# Patient Record
Sex: Male | Born: 1990 | Race: Black or African American | Hispanic: No | Marital: Single | Smoking: Former smoker
Health system: Southern US, Community
[De-identification: ages and names within clinical notes are randomized; demographics above are authoritative.]

## PROBLEM LIST (undated history)

## (undated) DIAGNOSIS — D573 Sickle-cell trait: Secondary | ICD-10-CM

---

## 2011-12-24 ENCOUNTER — Emergency Department (HOSPITAL_COMMUNITY)
Admission: EM | Admit: 2011-12-24 | Discharge: 2011-12-24 | Disposition: A | Discharge status: Home / Self Care | Payer: BC Managed Care – PPO | Attending: Emergency Medicine | Admitting: Emergency Medicine

## 2011-12-24 ENCOUNTER — Encounter: Payer: Self-pay | Admitting: Emergency Medicine

## 2011-12-24 DIAGNOSIS — R252 Cramp and spasm: Secondary | ICD-10-CM

## 2011-12-24 DIAGNOSIS — M7989 Other specified soft tissue disorders: Secondary | ICD-10-CM

## 2011-12-24 DIAGNOSIS — D573 Sickle-cell trait: Secondary | ICD-10-CM | POA: Insufficient documentation

## 2011-12-24 DIAGNOSIS — M79609 Pain in unspecified limb: Secondary | ICD-10-CM

## 2011-12-24 DIAGNOSIS — F17200 Nicotine dependence, unspecified, uncomplicated: Secondary | ICD-10-CM | POA: Insufficient documentation

## 2011-12-24 HISTORY — DX: Sickle-cell trait: D57.3

## 2011-12-24 MED ORDER — IBUPROFEN 600 MG PO TABS: 600.0000 mg | ORAL_TABLET | Freq: Four times a day (QID) | ORAL | Status: AC | PRN

## 2011-12-24 MED ORDER — IBUPROFEN 200 MG PO TABS
600.0000 mg | ORAL_TABLET | Freq: Once | ORAL | Status: AC
  Administered 2011-12-24: 600 mg via ORAL
  Filled 2011-12-24: qty 3

## 2011-12-24 NOTE — ED Provider Notes (Addendum)
History     CSN: [id number]  Arrival date & time 12/24/11  1242   First MD Initiated Contact with Patient 12/24/11 1540      Chief Complaint  Patient presents with  . Leg Pain    (Consider location/radiation/quality/duration/timing/severity/associated sxs/prior treatment) HPI Comments: Patient reports since yesterday has developed some crampy pain that comes and goes on the left side. He reports no skin rash, fever or drainage. No abrasions or lacerations or breaks in the skin. He did have a 2-1/2 hour drive from out of all of to [place] on Friday. He denies chest pain or shortness of breath. She denies any falls or obvious injuries. He reports that he is otherwise healthy.  Patient is a 21 y.o. male presenting with leg pain. The history is provided by the patient.  Leg Pain  Pertinent negatives include no numbness.    Past Medical History  Diagnosis Date  . Sickle-cell trait     History reviewed. No pertinent past surgical history.  History reviewed. No pertinent family history.  History  Substance Use Topics  . Smoking status: Current Everyday Smoker  . Smokeless tobacco: Not on file  . Alcohol Use: Yes      Review of Systems  Constitutional: Negative.   Respiratory: Negative for chest tightness and shortness of breath.   Cardiovascular: Positive for leg swelling. Negative for chest pain and palpitations.  Musculoskeletal: Negative for back pain and joint swelling.  Skin: Negative for color change, rash and wound.  Neurological: Negative for weakness, light-headedness and numbness.    Allergies  Review of patient's allergies indicates no known allergies.  Home Medications  No current outpatient prescriptions on file.  BP 149/82  Pulse 60  Temp(Src) 98.3 F (36.8 C) (Oral)  Resp 18  SpO2 99%  Physical Exam  Nursing note and vitals reviewed. Constitutional: He appears well-developed and well-nourished.  Pulmonary/Chest: Effort normal. No respiratory  distress.  Musculoskeletal: Normal range of motion. He exhibits no edema and no tenderness.  Skin: Skin is warm and dry. No rash noted. No erythema.    ED Course  Procedures (including critical care time)  Labs Reviewed - No data to display No results found.   1. Muscle cramp       MDM  Minor risk for DVT since no good alternative diagnosis is complete.  Doppler study is negative.  Results discussed with family and pt.           Gavin Pound. Oletta Lamas, MD 12/24/11 1704  Gavin Pound. Dearius Hoffmann, MD 12/24/11 1704

## 2011-12-24 NOTE — ED Notes (Signed)
Denies injury to leg  Back of leg hurts from knee to ankle states gave blood for $ he states

## 2011-12-24 NOTE — Discharge Instructions (Signed)
 Leg Cramps Leg cramps that occur during exercise can be caused by poor circulation or dehydration. However, muscle cramps that occur at rest or during the night are usually not due to any serious medical problem. Heat cramps may cause muscle spasms during hot weather.  CAUSES There is no clear cause for muscle cramps. However, dehydration may be a factor for those who do not drink enough fluids and those who exercise in the heat. Imbalances in the level of sodium, potassium, calcium  or magnesium in the muscle tissue may also be a factor. Some medications, such as water pills (diuretics), may cause loss of chemicals that the body needs (like sodium and potassium) and cause muscle cramps. TREATMENT   Make sure your diet has enough fluids and essential minerals for the muscle to work normally.   Avoid strenuous exercise for several days if you have been having frequent leg cramps.   Stretch and massage the cramped muscle for several minutes.   Some medicines may be helpful in some patients with night cramps. Only take over-the-counter or prescription medicines as directed by your caregiver.  SEEK IMMEDIATE MEDICAL CARE IF:   Your leg cramps become worse.   Your foot becomes cold, numb, or blue.  Document Released: 01/10/2005 Document Revised: 08/15/2011 Document Reviewed: 12/28/2008 [facility] Patient Information 2012 [place], [place].     Your ultrasound was normal.  Follow up with your primary care physician next week if symptoms continue to persist.

## 2011-12-24 NOTE — ED Notes (Signed)
Left leg pain since last night, pt just came from giving blood

## 2011-12-24 NOTE — Progress Notes (Signed)
*  PRELIMINARY RESULTS*  Left Lower Extremity Venous Duplex has been performed.  Left:  No evidence of DVT, superficial thrombosis, or Baker's cyst.   Farrel Demark RDMS 12/24/2011, 4:45 PM

## 2014-08-10 ENCOUNTER — Emergency Department (HOSPITAL_COMMUNITY)
Admission: EM | Admit: 2014-08-10 | Discharge: 2014-08-10 | Disposition: A | Discharge status: Home / Self Care | Payer: BC Managed Care – PPO | Attending: Emergency Medicine | Admitting: Emergency Medicine

## 2014-08-10 ENCOUNTER — Encounter (HOSPITAL_COMMUNITY): Payer: Self-pay | Admitting: Emergency Medicine

## 2014-08-10 ENCOUNTER — Emergency Department (HOSPITAL_COMMUNITY): Payer: BC Managed Care – PPO

## 2014-08-10 DIAGNOSIS — N451 Epididymitis: Secondary | ICD-10-CM

## 2014-08-10 DIAGNOSIS — F17200 Nicotine dependence, unspecified, uncomplicated: Secondary | ICD-10-CM | POA: Insufficient documentation

## 2014-08-10 DIAGNOSIS — N509 Disorder of male genital organs, unspecified: Secondary | ICD-10-CM | POA: Diagnosis present

## 2014-08-10 DIAGNOSIS — N453 Epididymo-orchitis: Secondary | ICD-10-CM | POA: Insufficient documentation

## 2014-08-10 DIAGNOSIS — Z862 Personal history of diseases of the blood and blood-forming organs and certain disorders involving the immune mechanism: Secondary | ICD-10-CM | POA: Insufficient documentation

## 2014-08-10 MED ORDER — FENTANYL CITRATE 0.05 MG/ML IJ SOLN: 50.0000 ug | Freq: Once | INTRAMUSCULAR | Status: DC

## 2014-08-10 MED ORDER — OXYCODONE-ACETAMINOPHEN 5-325 MG PO TABS
1.0000 | ORAL_TABLET | Freq: Once | ORAL | Status: AC
  Administered 2014-08-10: 1 via ORAL
  Filled 2014-08-10: qty 1

## 2014-08-10 MED ORDER — HYDROCODONE-ACETAMINOPHEN 5-325 MG PO TABS: 1.0000 | ORAL_TABLET | Freq: Four times a day (QID) | ORAL | Status: DC | PRN

## 2014-08-10 MED ORDER — CIPROFLOXACIN HCL 500 MG PO TABS: 500.0000 mg | ORAL_TABLET | Freq: Two times a day (BID) | ORAL | Status: DC

## 2014-08-10 NOTE — Discharge Instructions (Signed)
Cipro as prescribed. Hydrocodone as prescribed as needed for pain.  Return to the emergency department for worsening pain, high fever, and follow up with your primary Dr. if not improving in the next week.   Epididymitis Epididymitis is a swelling (inflammation) of the epididymis. The epididymis is a cord-like structure along the back part of the testicle. Epididymitis is usually, but not always, caused by infection. This is usually a sudden problem beginning with chills, fever and pain behind the scrotum and in the testicle. There may be swelling and redness of the testicle. DIAGNOSIS  Physical examination will reveal a tender, swollen epididymis. Sometimes, cultures are obtained from the urine or from prostate secretions to help find out if there is an infection or if the cause is a different problem. Sometimes, blood work is performed to see if your white blood cell count is elevated and if a germ (bacterial) or viral infection is present. Using this knowledge, an appropriate medicine which kills germs (antibiotic) can be chosen by your caregiver. A viral infection causing epididymitis will most often go away (resolve) without treatment. HOME CARE INSTRUCTIONS   Hot sitz baths for 20 minutes, 4 times per day, may help relieve pain.  Only take over-the-counter or prescription medicines for pain, discomfort or fever as directed by your caregiver.  Take all medicines, including antibiotics, as directed. Take the antibiotics for the full prescribed length of time even if you are feeling better.  It is very important to keep all follow-up appointments. SEEK IMMEDIATE MEDICAL CARE IF:   You have a fever.  You have pain not relieved with medicines.  You have any worsening of your problems.  Your pain seems to come and go.  You develop pain, redness, and swelling in the scrotum and surrounding areas. MAKE SURE YOU:   Understand these instructions.  Will watch your condition.  Will get  help right away if you are not doing well or get worse. Document Released: 11/30/2000 Document Revised: 02/25/2012 Document Reviewed: 10/20/2009 [facility] Patient Information 2015 [place], [place]. This information is not intended to replace advice given to you by your health care provider. Make sure you discuss any questions you have with your health care provider.

## 2014-08-10 NOTE — ED Notes (Addendum)
Pt reports waking up this am with severe pain to right testicle. No relief with ibuprofen pta. Denies any urinary symptoms.

## 2014-08-10 NOTE — ED Provider Notes (Signed)
CSN: [id number]     Arrival date & time 08/10/14  1304 History   First MD Initiated Contact with Patient 08/10/14 1353     Chief Complaint  Patient presents with  . Testicle Pain     (Consider location/radiation/quality/duration/timing/severity/associated sxs/prior Treatment) Patient is a 23 y.o. male presenting with testicular pain. The history is provided by the patient.  Testicle Pain This is a new problem. Episode onset: This Morning. The problem occurs constantly. The problem has been gradually worsening. Pertinent negatives include no chest pain and no abdominal pain. Nothing aggravates the symptoms. Nothing relieves the symptoms. He has tried nothing for the symptoms. The treatment provided no relief.    Past Medical History  Diagnosis Date  . Sickle-cell trait    History reviewed. No pertinent past surgical history. History reviewed. No pertinent family history. History  Substance Use Topics  . Smoking status: Current Every Day Smoker  . Smokeless tobacco: Not on file  . Alcohol Use: Yes    Review of Systems  Cardiovascular: Negative for chest pain.  Gastrointestinal: Negative for abdominal pain.  Genitourinary: Positive for testicular pain.  All other systems reviewed and are negative.     Allergies  Review of patient's allergies indicates no known allergies.  Home Medications   Prior to Admission medications   Medication Sig Start Date End Date Taking? Authorizing Provider  ibuprofen (ADVIL,MOTRIN) 200 MG tablet Take 200-800 mg by mouth every 6 (six) hours as needed for mild pain or moderate pain.   Yes Historical Provider, MD   BP 134/80  Pulse 65  Temp(Src) 98.4 F (36.9 C) (Oral)  Resp 24  Ht 6' (1.829 m)  Wt 245 lb (111.131 kg)  BMI 33.22 kg/m2  SpO2 99% Physical Exam  Nursing note and vitals reviewed. Constitutional: He is oriented to person, place, and time. He appears well-developed and well-nourished. No distress.  HENT:  Head:  Normocephalic and atraumatic.  Neck: Normal range of motion. Neck supple.  Genitourinary:  The external genitalia appears grossly normal. The right testicle is tender to palpation, however is freely mobile. There are no suspicious lesions.  Neurological: He is alert and oriented to person, place, and time.  Skin: Skin is warm and dry. He is not diaphoretic.    ED Course  Procedures (including critical care time) Labs Review Labs Reviewed - No data to display  Imaging Review US Scrotum  08/10/2014   CLINICAL DATA:  RIGHT testicle pain.  No trauma.  EXAM: SCROTAL ULTRASOUND  DOPPLER ULTRASOUND OF THE TESTICLES  TECHNIQUE: Complete ultrasound examination of the testicles, epididymis, and other scrotal structures was performed. Color and spectral Doppler ultrasound were also utilized to evaluate blood flow to the testicles.  COMPARISON:  None.  FINDINGS: Right testicle  Measurements: 4.2 x 2.4 x 3.6 cm. No mass or microlithiasis visualized.  Left testicle  Measurements: 4.4 x 2.0 x 2.5 cm. No mass or microlithiasis visualized.  Right epididymis:  Mildly enlarged.  Left epididymis:  Normal in size and appearance.  Hydrocele:  Present on the RIGHT.  Varicocele:  None visualized.  Pulsed Doppler interrogation of both testes demonstrates low resistance arterial and venous waveforms bilaterally.  IMPRESSION: Mildly enlarged RIGHT epididymis. RIGHT hydrocele. Normal testicles.   Electronically Signed   By: Davonna Belling M.D.   On: 08/10/2014 14:50   [place] Art/ven Flow Abd Pelv Doppler  08/10/2014   CLINICAL DATA:  RIGHT testicle pain.  No trauma.  EXAM: SCROTAL ULTRASOUND  DOPPLER ULTRASOUND OF THE  TESTICLES  TECHNIQUE: Complete ultrasound examination of the testicles, epididymis, and other scrotal structures was performed. Color and spectral Doppler ultrasound were also utilized to evaluate blood flow to the testicles.  COMPARISON:  None.  FINDINGS: Right testicle  Measurements: 4.2 x 2.4 x 3.6 cm. No mass or  microlithiasis visualized.  Left testicle  Measurements: 4.4 x 2.0 x 2.5 cm. No mass or microlithiasis visualized.  Right epididymis:  Mildly enlarged.  Left epididymis:  Normal in size and appearance.  Hydrocele:  Present on the RIGHT.  Varicocele:  None visualized.  Pulsed Doppler interrogation of both testes demonstrates low resistance arterial and venous waveforms bilaterally.  IMPRESSION: Mildly enlarged RIGHT epididymis. RIGHT hydrocele. Normal testicles.   Electronically Signed   By: Davonna Belling M.D.   On: 08/10/2014 14:50     EKG Interpretation None      MDM   Final diagnoses:  None    Ultrasound reveals an enlarged right epididymis consistent with epididymitis. We'll treat with Cipro, pain meds, and when necessary followup.    Geoffery Lyons, MD 08/10/14 [phone]

## 2015-01-23 IMAGING — US US SCROTUM
1 series · 14 of 25 positions shown · non-contrast
Comparison: None.

CLINICAL DATA: RIGHT testicle pain.  No trauma.

EXAM:
SCROTAL ULTRASOUND
DOPPLER ULTRASOUND OF THE TESTICLES
TECHNIQUE: Complete ultrasound examination of the testicles, epididymis, and
other scrotal structures was performed. Color and spectral Doppler
ultrasound were also utilized to evaluate blood flow to the
testicles.

[Series 1: us scrotum · 0.06mm/px · 14 of 47 slices shown]
[im 1/47]
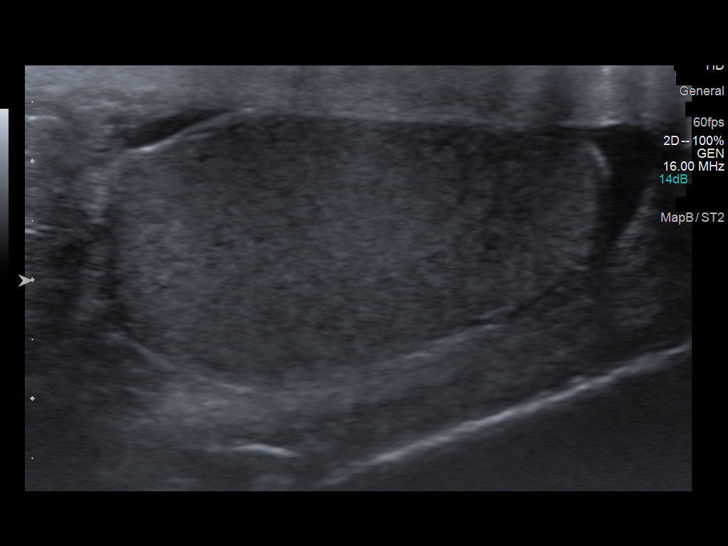
[im 4/47]
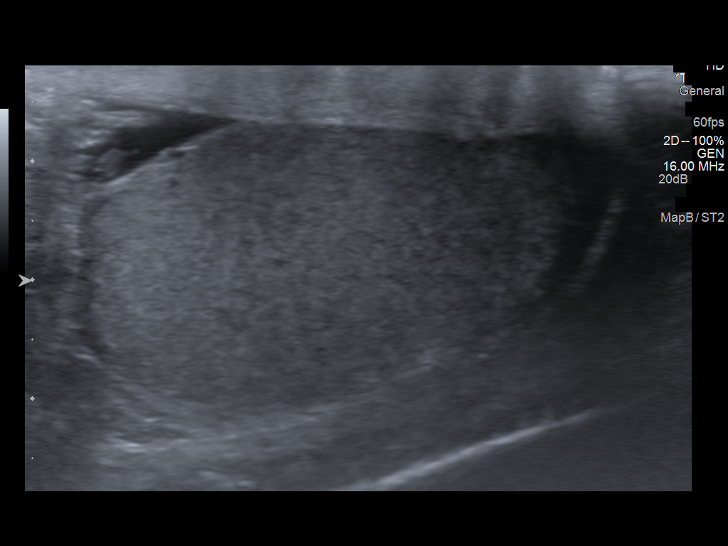
[im 8/47]
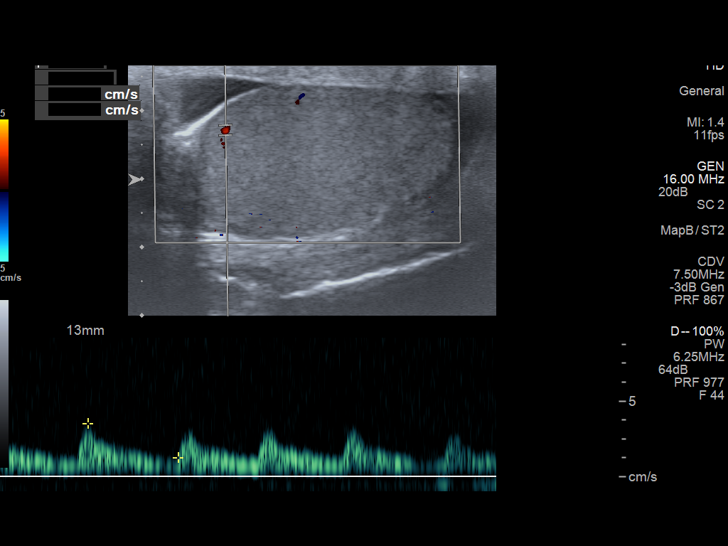
[im 12/47]
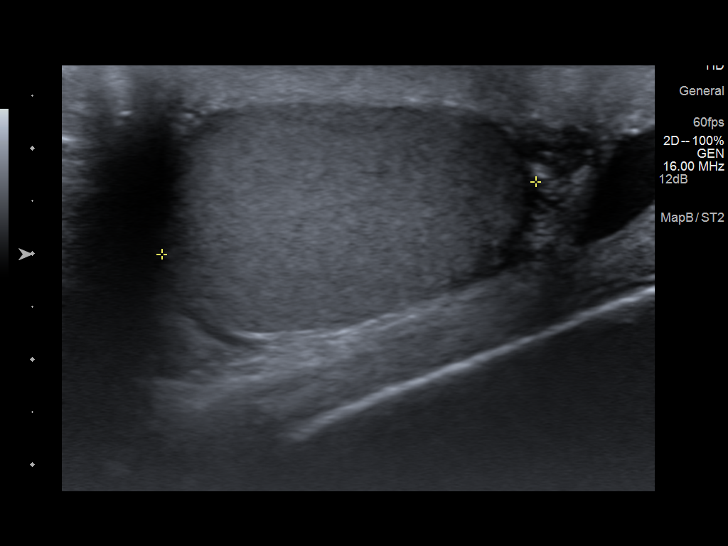
[im 16/47]
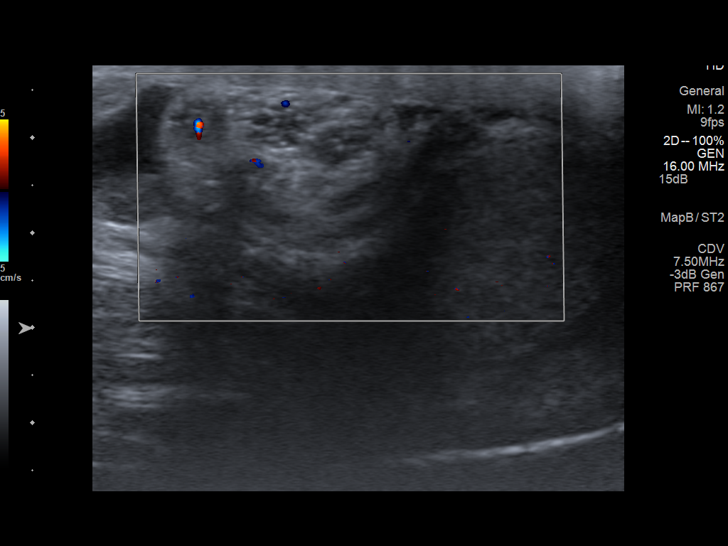
[im 18/47]
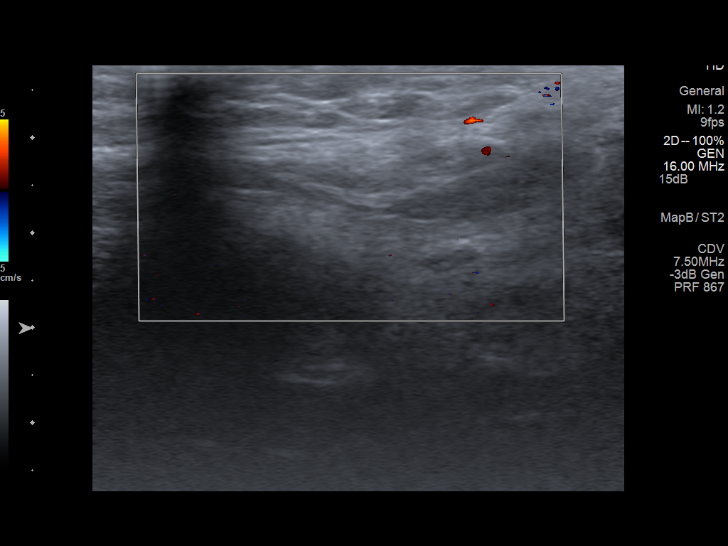
[im 22/47]
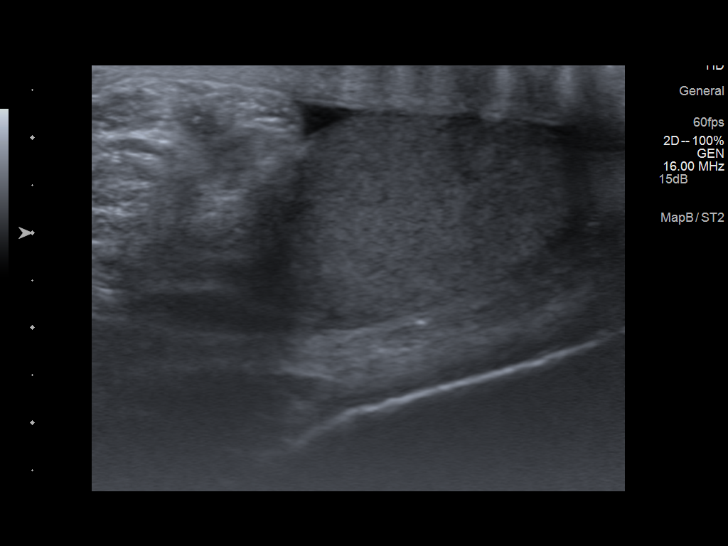
[im 25/47]
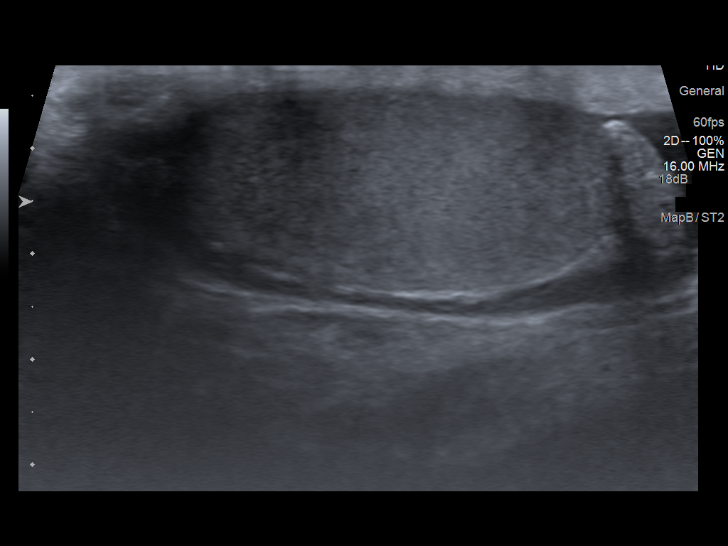
[im 29/47]
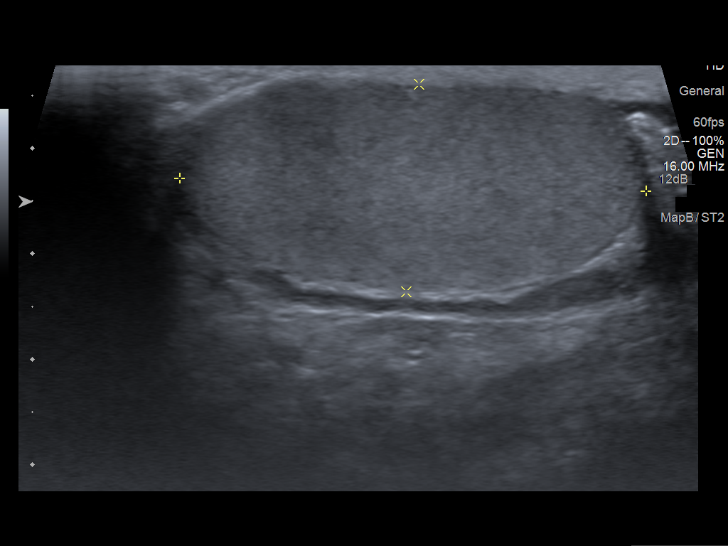
[im 31/47]
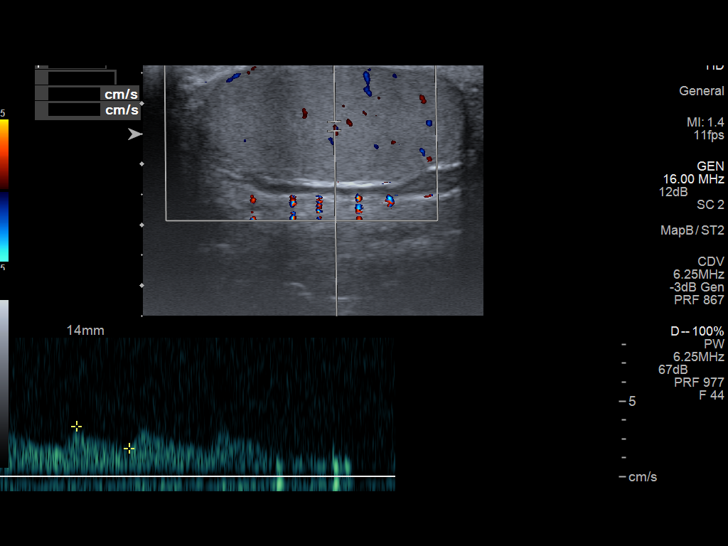
[im 35/47]
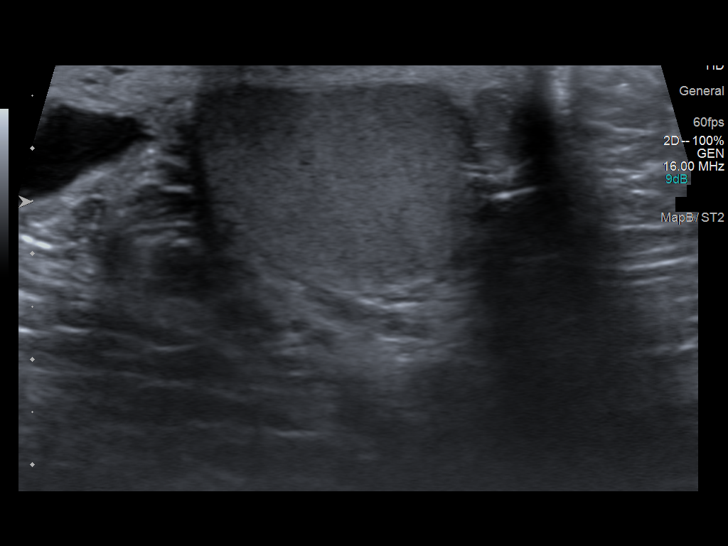
[im 39/47]
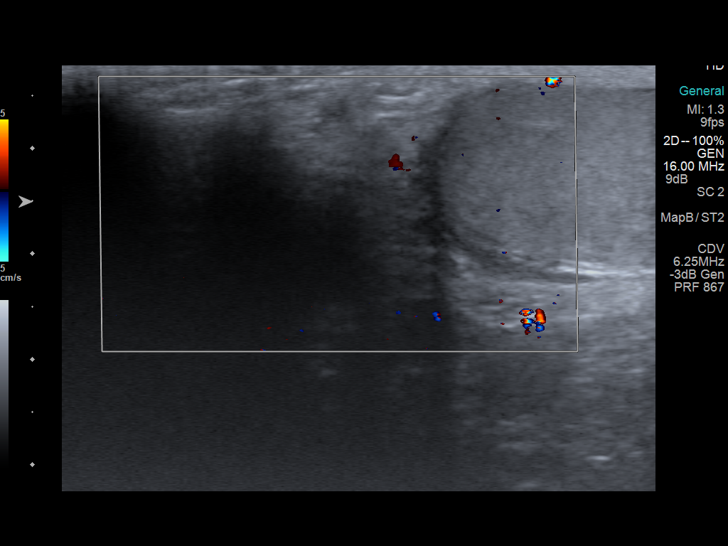
[im 43/47]
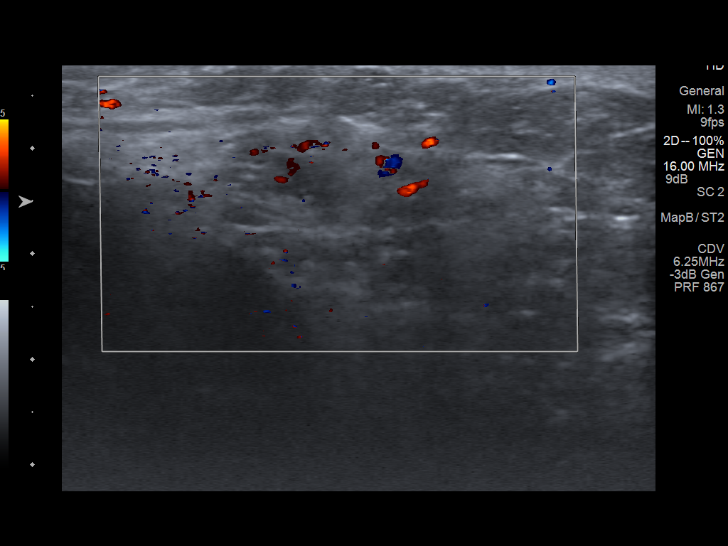
[im 47/47]
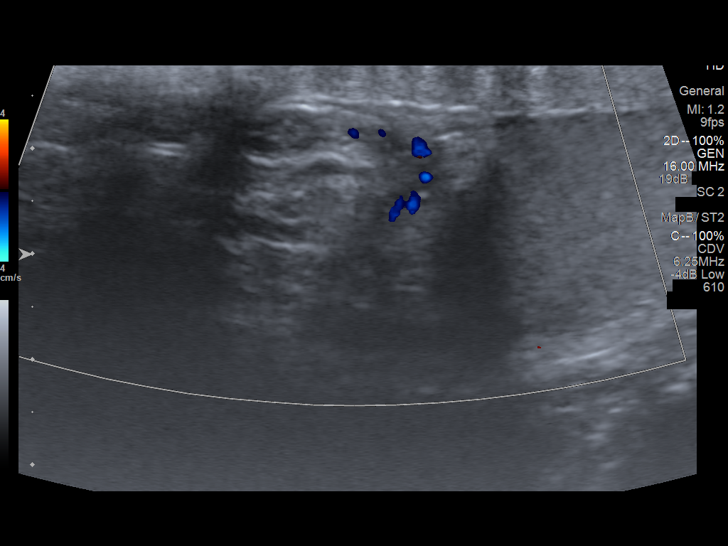

[14 of 25 positions shown; findings below may reference images not displayed]

FINDINGS: Right testicle

Measurements: 4.2 x 2.4 x 3.6 cm. No mass or microlithiasis
visualized.

Left testicle

Measurements: 4.4 x 2.0 x 2.5 cm. No mass or microlithiasis
visualized.

Right epididymis:  Mildly enlarged.

Left epididymis:  Normal in size and appearance.

Hydrocele:  Present on the RIGHT.

Varicocele:  None visualized.

Pulsed Doppler interrogation of both testes demonstrates low
resistance arterial and venous waveforms bilaterally.
IMPRESSION: Mildly enlarged RIGHT epididymis. RIGHT hydrocele. Normal testicles.

## 2020-04-04 ENCOUNTER — Emergency Department (HOSPITAL_COMMUNITY)
Admission: EM | Admit: 2020-04-04 | Discharge: 2020-04-04 | Disposition: A | Discharge status: Home / Self Care | Payer: BC Managed Care – PPO | Attending: Emergency Medicine | Admitting: Emergency Medicine

## 2020-04-04 ENCOUNTER — Other Ambulatory Visit: Payer: Self-pay

## 2020-04-04 ENCOUNTER — Encounter (HOSPITAL_COMMUNITY): Payer: Self-pay | Admitting: Emergency Medicine

## 2020-04-04 ENCOUNTER — Emergency Department (HOSPITAL_COMMUNITY): Payer: BC Managed Care – PPO

## 2020-04-04 DIAGNOSIS — N50811 Right testicular pain: Secondary | ICD-10-CM

## 2020-04-04 DIAGNOSIS — R1031 Right lower quadrant pain: Secondary | ICD-10-CM | POA: Diagnosis not present

## 2020-04-04 DIAGNOSIS — F17210 Nicotine dependence, cigarettes, uncomplicated: Secondary | ICD-10-CM | POA: Diagnosis not present

## 2020-04-04 DIAGNOSIS — N451 Epididymitis: Secondary | ICD-10-CM | POA: Insufficient documentation

## 2020-04-04 LAB — URINALYSIS, ROUTINE W REFLEX MICROSCOPIC
Bilirubin Urine: NEGATIVE
Glucose, UA: 50 mg/dL — AB
Hgb urine dipstick: NEGATIVE
Ketones, ur: NEGATIVE mg/dL
Leukocytes,Ua: NEGATIVE
Nitrite: NEGATIVE
Protein, ur: 30 mg/dL — AB
Specific Gravity, Urine: 1.033 — ABNORMAL HIGH (ref 1.005–1.030)
pH: 5 (ref 5.0–8.0)

## 2020-04-04 MED ORDER — DOXYCYCLINE HYCLATE 100 MG PO TABS
100.0000 mg | ORAL_TABLET | Freq: Once | ORAL | Status: AC
  Administered 2020-04-04: 100 mg via ORAL
  Filled 2020-04-04: qty 1

## 2020-04-04 MED ORDER — LIDOCAINE HCL (PF) 1 % IJ SOLN
INTRAMUSCULAR | Status: AC
  Administered 2020-04-04: 3 mL
  Filled 2020-04-04: qty 5

## 2020-04-04 MED ORDER — DOXYCYCLINE HYCLATE 100 MG PO CAPS: 100.0000 mg | ORAL_CAPSULE | Freq: Two times a day (BID) | ORAL | 0 refills | Status: DC

## 2020-04-04 MED ORDER — CEFTRIAXONE SODIUM 500 MG IJ SOLR
500.0000 mg | Freq: Once | INTRAMUSCULAR | Status: AC
  Administered 2020-04-04: 500 mg via INTRAMUSCULAR
  Filled 2020-04-04: qty 500

## 2020-04-04 MED ORDER — OXYCODONE-ACETAMINOPHEN 5-325 MG PO TABS
1.0000 | ORAL_TABLET | Freq: Once | ORAL | Status: AC
  Administered 2020-04-04: 18:00:00 1 via ORAL
  Filled 2020-04-04: qty 1

## 2020-04-04 NOTE — ED Provider Notes (Signed)
[facility] EMERGENCY DEPARTMENT Provider Note   CSN: [id number] Arrival date & time: 04/04/20  1346     History Chief Complaint  Patient presents with  . Testicle Pain    Nathan Arnold is a 29 y.o. male.  HPI  Patient is a 29 year old male with a history of epididymitis several years ago no other pertinent past medical history other than sickle cell trait.  Patient states that 1 hour prior to arrival he was laying on the couch and his sudden onset of right testicle pain.  He denies any exertion, trauma, STI exposure.  He states that he has no concern for sexually transmitted disease.  He states that he has some associated right lower quadrant abdominal pain but denies any nausea, vomiting, penile discharge, urinary frequency, dysuria, hematuria frequency urgency.  He denies any headache, dizziness, pain with defecation or changes with bowel movements.     Past Medical History:  Diagnosis Date  . Sickle-cell trait ([place])     There are no problems to display for this patient.   History reviewed. No pertinent surgical history.     No family history on file.  Social History   Tobacco Use  . Smoking status: Current Every Day Smoker  Substance Use Topics  . Alcohol use: Yes  . Drug use: Not on file    Home Medications Prior to Admission medications   Medication Sig Start Date End Date Taking? Authorizing Provider  ciprofloxacin (CIPRO) 500 MG tablet Take 1 tablet (500 mg total) by mouth 2 (two) times daily. 08/10/14   Veryl Speak, MD  doxycycline (VIBRAMYCIN) 100 MG capsule Take 1 capsule (100 mg total) by mouth 2 (two) times daily. 04/04/20   Tedd Sias, PA  HYDROcodone-acetaminophen (NORCO) 5-325 MG per tablet Take 1-2 tablets by mouth every 6 (six) hours as needed. 08/10/14   Veryl Speak, MD  ibuprofen (ADVIL,MOTRIN) 200 MG tablet Take 200-800 mg by mouth every 6 (six) hours as needed for mild pain or moderate pain.    [provider]     Allergies    Patient has no known allergies.  Review of Systems   Review of Systems  Constitutional: Negative for chills and fever.  HENT: Negative for congestion.   Eyes: Negative for pain.  Respiratory: Negative for cough and shortness of breath.   Cardiovascular: Negative for chest pain and leg swelling.  Gastrointestinal: Positive for abdominal pain. Negative for nausea and vomiting.  Genitourinary: Positive for testicular pain. Negative for decreased urine volume, discharge, dysuria, flank pain, penile pain, penile swelling, scrotal swelling and urgency.  Musculoskeletal: Negative for myalgias.  Skin: Negative for rash.  Neurological: Negative for dizziness and headaches.    Physical Exam Updated Vital Signs BP (!) 121/94 (BP Location: Left Arm)   Pulse 67   Temp 98.5 F (36.9 C) (Oral)   Resp 16   Ht 6' (1.829 m)   Wt 117.9 kg   SpO2 100%   BMI 35.26 kg/m   Physical Exam Vitals and nursing note reviewed. Exam conducted with a chaperone present.  Constitutional:      General: He is not in acute distress.    Appearance: He is obese.     Comments: Appears uncomfortable.  Pleasant, 29 year old male who is able to answer questions appropriately and follow commands  HENT:     Head: Normocephalic and atraumatic.     Nose: Nose normal.     Mouth/Throat:     Mouth: Mucous membranes are  moist.  Eyes:     General: No scleral icterus. Cardiovascular:     Rate and Rhythm: Normal rate and regular rhythm.     Pulses: Normal pulses.     Heart sounds: Normal heart sounds.  Pulmonary:     Effort: Pulmonary effort is normal. No respiratory distress.     Breath sounds: No wheezing.  Abdominal:     Palpations: Abdomen is soft.     Tenderness: There is no abdominal tenderness. There is no right CVA tenderness, left CVA tenderness, guarding or rebound.  Genitourinary:    Penis: Normal.      Testes: Normal.     Comments: Testes are normal appearing.  Right testicle is  extremely tender to palpation.  Left testicle without tenderness.  No urethral discharge evident.  No lesions.  No inguinal lymphadenopathy.  No evidence of cellulitis or fullness of testicle.  No irregularities palpated.  Equivocal Prehn sign.  Cremasteric reflex is intact. Musculoskeletal:     Cervical back: Normal range of motion.     Right lower leg: No edema.     Left lower leg: No edema.  Skin:    General: Skin is warm and dry.     Capillary Refill: Capillary refill takes less than 2 seconds.  Neurological:     Mental Status: He is alert. Mental status is at baseline.  Psychiatric:        Mood and Affect: Mood normal.        Behavior: Behavior normal.     ED Results / Procedures / Treatments   Labs (all labs ordered are listed, but only abnormal results are displayed) Labs Reviewed  URINALYSIS, ROUTINE W REFLEX MICROSCOPIC - Abnormal; Notable for the following components:      Result Value   Specific Gravity, Urine 1.033 (*)    Glucose, UA 50 (*)    Protein, ur 30 (*)    Bacteria, UA RARE (*)    All other components within normal limits  GC/CHLAMYDIA PROBE AMP ([place]) NOT AT [facility]    EKG None  Radiology US SCROTUM W/DOPPLER  Result Date: 04/04/2020 CLINICAL DATA:  Acute right testicular pain. EXAM: SCROTAL ULTRASOUND DOPPLER ULTRASOUND OF THE TESTICLES TECHNIQUE: Complete ultrasound examination of the testicles, epididymis, and other scrotal structures was performed. Color and spectral Doppler ultrasound were also utilized to evaluate blood flow to the testicles. COMPARISON:  August 10, 2014. FINDINGS: Right testicle Measurements: 4.4 x 2.9 x 2.5 cm. No mass or microlithiasis visualized. Left testicle Measurements: 4.5 x 2.3 x 2.2 cm. 3 mm cyst is noted. No mass or microlithiasis visualized. Right epididymis: Enlarged right epididymis is noted which is not significantly changed compared to prior exam. It is slightly hypervascular suggesting possible epididymitis.  Left epididymis:  Normal in size and appearance. Hydrocele:  Small right hydrocele is noted. Varicocele:  None visualized. Pulsed Doppler interrogation of both testes demonstrates normal low resistance arterial and venous waveforms bilaterally. IMPRESSION: Enlarged right epididymis is noted which was enlarged on the prior exam of 2015. It does appear to be mildly hypervascular on Doppler and the possibility of epididymitis cannot be excluded. There is no evidence of testicular mass or torsion. Electronically Signed   By: Lupita Raider M.D.   On: 04/04/2020 15:18    Procedures Procedures (including critical care time)  Medications Ordered in ED Medications  oxyCODONE-acetaminophen (PERCOCET/ROXICET) 5-325 MG per tablet 1 tablet (1 tablet Oral Given 04/04/20 1754)  cefTRIAXone (ROCEPHIN) injection 500 mg (500 mg Intramuscular  Given 04/04/20 1754)  doxycycline (VIBRA-TABS) tablet 100 mg (100 mg Oral Given 04/04/20 1755)  lidocaine (PF) (XYLOCAINE) 1 % injection (3 mLs  Given 04/04/20 1755)    ED Course  I have reviewed the triage vital signs and the nursing notes.  Pertinent labs & imaging results that were available during my care of the patient were reviewed by me and considered in my medical decision making (see chart for details).    MDM Rules/Calculators/A&P                      Patient is 29 year old male with no pertinent past medical history apart from an episode of epididymitis 5 years ago.  Presented today with what he describes as similar symptoms.  He states that he had sudden onset of right testicular pain that began approximately 1 hour before arrival in ED.  Physical exam is unremarkable.  Cremasteric reflex appears to be intact and there is no unusual lie of the testicle.  It is tender to touch however this appears to be somewhat relieved with being lifted.  This is consistent with epididymitis.  Will obtain ultrasound of testicles to rule out torsion.  Ultrasound is without acute  abnormality.  There is no evidence of testicular mass or torsion however epididymitis cannot be ruled out.  Given ultrasound exam and my physical exam suspect epididymitis.  We will treat patient with IM Rocephin and doxycycline for 10 days.  He will follow-up with urology as this is the second episode of epididymitis.  Urinalysis is without any acute abnormalities.  There is some evidence of dehydration and some evidence of glucose and protein present in urine which is likely secondary to patient not having eaten much today.  He will follow up with urology for reevaluation.  Patient has no history of diabetes or other immunosuppressive disease.  Doubt Fournier's gangrene.  Low suspicion for testicular torsion given exam.  Patient was not exerting himself or to any motions that would likely resulted in torsion during onset of pain.  Some concern for epididymitis given his age and history of prior epididymitis.  Patient is tolerating p.o. and is agreeable to plan at this time.  His pain is now 2/10 much improved after single dose of Percocet several hours prior.   --- The medical records were personally reviewed by myself. I personally reviewed all lab results and interpreted all imaging studies and either concurred with their official read or contacted radiology for clarification.   This patient appears reasonably screened and I doubt any other medical condition requiring further workup, evaluation, or treatment in the ED at this time prior to discharge.   Patient's vitals are WNL apart from vital sign abnormalities discussed above, patient is in NAD, and able to ambulate in the ED at their baseline and able to tolerate PO.  Pain has been managed or a plan has been made for home management and has no complaints prior to discharge. Patient is comfortable with above plan and for discharge at this time. All questions were answered prior to disposition. Results from the ER workup discussed with the  patient face to face and all questions answered to the best of my ability. The patient is safe for discharge with strict return precautions. Patient appears safe for discharge with appropriate follow-up. Conveyed my impression with the patient and they voiced understanding and are agreeable to plan.   An After Visit Summary was printed and given to the patient.  Portions of  this note were generated with Scientist, clinical (histocompatibility and immunogenetics). Dictation errors may occur despite best attempts at proofreading.    Final Clinical Impression(s) / ED Diagnoses Final diagnoses:  Epididymitis  Pain in right testicle    Rx / DC Orders ED Discharge Orders         Ordered    doxycycline (VIBRAMYCIN) 100 MG capsule  2 times daily     04/04/20 1735           Gailen Shelter, [place] 04/04/20 1942    Benjiman Core, MD 04/05/20 1505

## 2020-04-04 NOTE — ED Notes (Signed)
Pt's fiance, Heath Gold, to be called when pt in room. [phone]

## 2020-04-04 NOTE — Discharge Instructions (Signed)
Please take antibiotics as prescribed.  Please read the attached information on epididymitis.  You may use cool contacts/compresses on your testicle to decrease pain.  Take Tylenol and ibuprofen.  Please call urology to make an appointment within the week.  Please use Tylenol or ibuprofen for pain.  You may use 600 mg ibuprofen every 6 hours or 1000 mg of Tylenol every 6 hours.  You may choose to alternate between the 2.  This would be most effective.  Not to exceed 4 g of Tylenol within 24 hours.  Not to exceed 3200 mg ibuprofen 24 hours.

## 2020-04-04 NOTE — ED Triage Notes (Signed)
Pt states just 1 hour ago was lying on couch when had a sudden onset of right testicle pain.

## 2020-09-17 IMAGING — US US SCROTUM W/ DOPPLER COMPLETE
1 series · 13 of 25 positions shown · non-contrast
Comparison: August 10, 2014.

CLINICAL DATA: Acute right testicular pain.

EXAM:
SCROTAL ULTRASOUND
DOPPLER ULTRASOUND OF THE TESTICLES
TECHNIQUE: Complete ultrasound examination of the testicles, epididymis, and
other scrotal structures was performed. Color and spectral Doppler
ultrasound were also utilized to evaluate blood flow to the
testicles.

[Series 1: us scrotum w/ doppler complete · 13 of 52 slices shown]
[im 1/52]
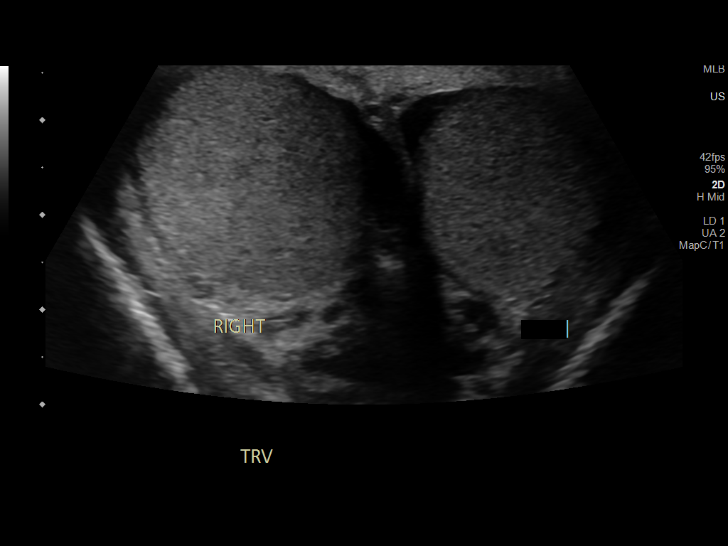
[im 5/52]
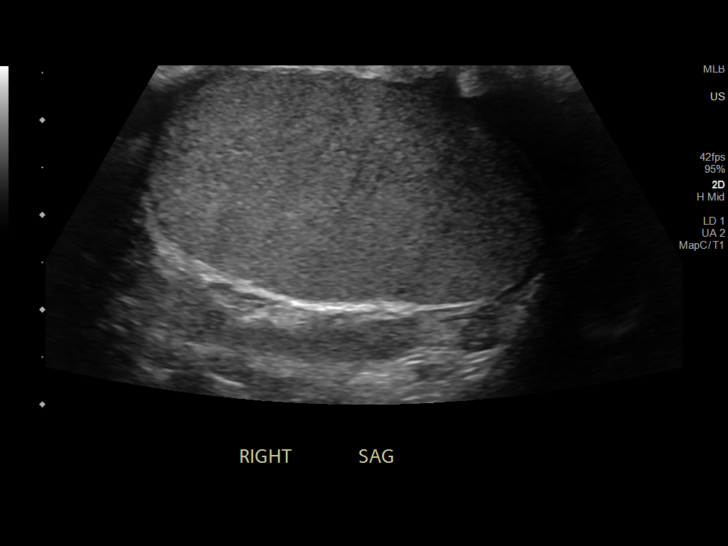
[im 9/52]
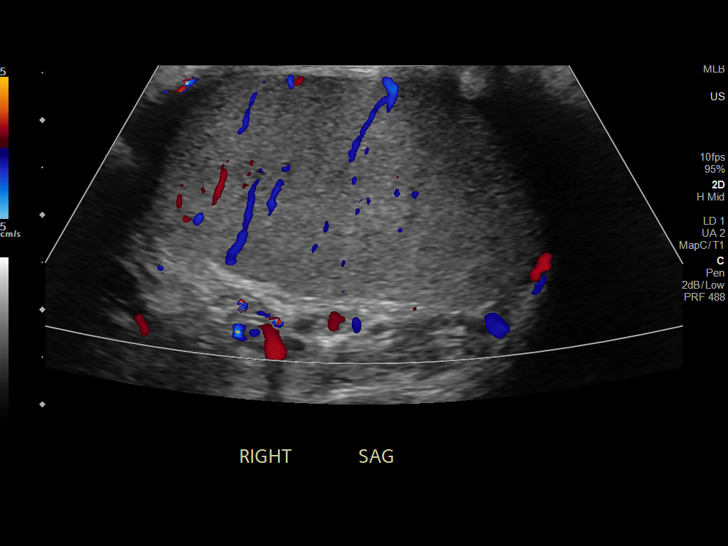
[im 13/52]
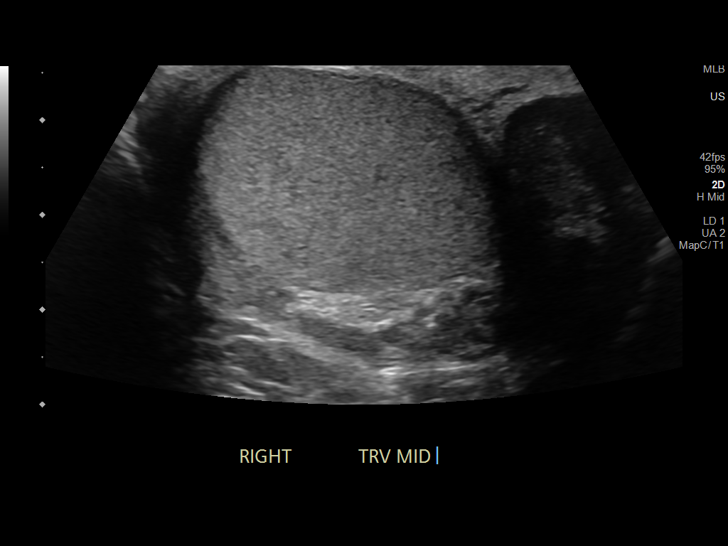
[im 18/52]
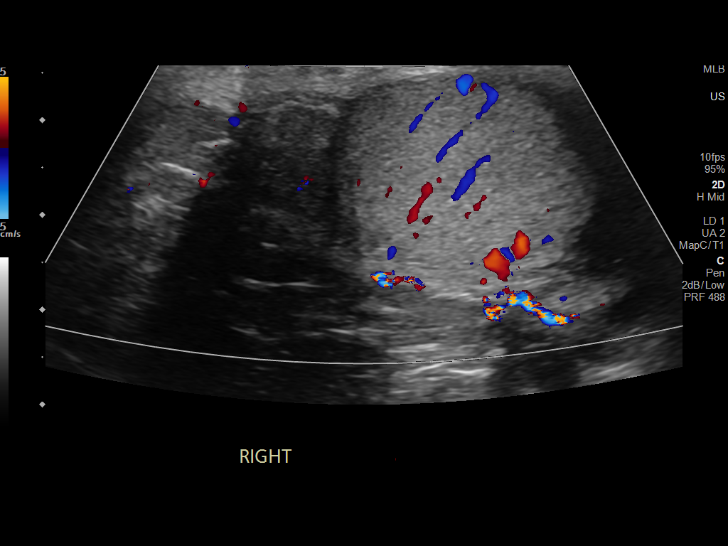
[im 22/52]
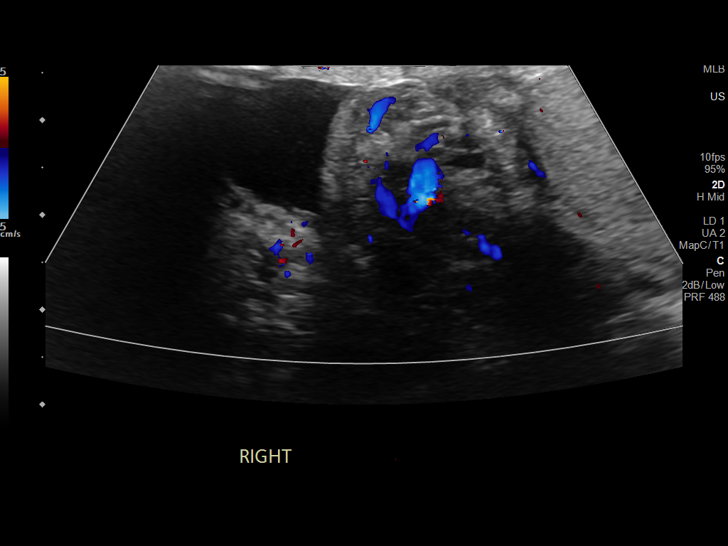
[im 26/52]
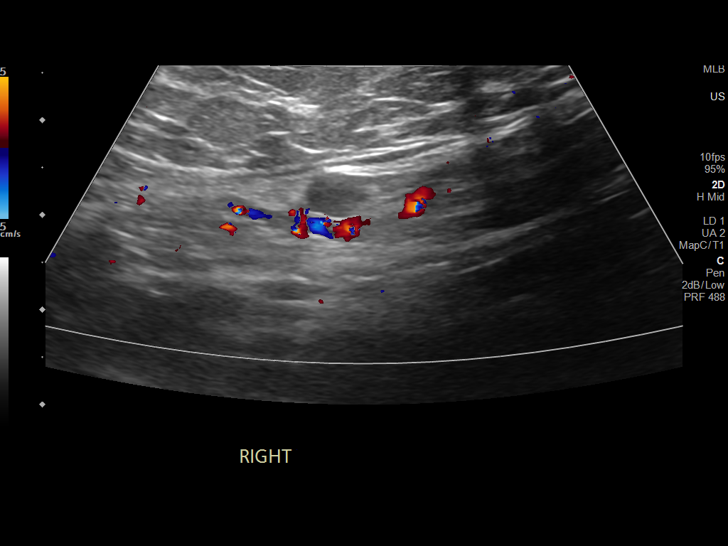
[im 30/52]
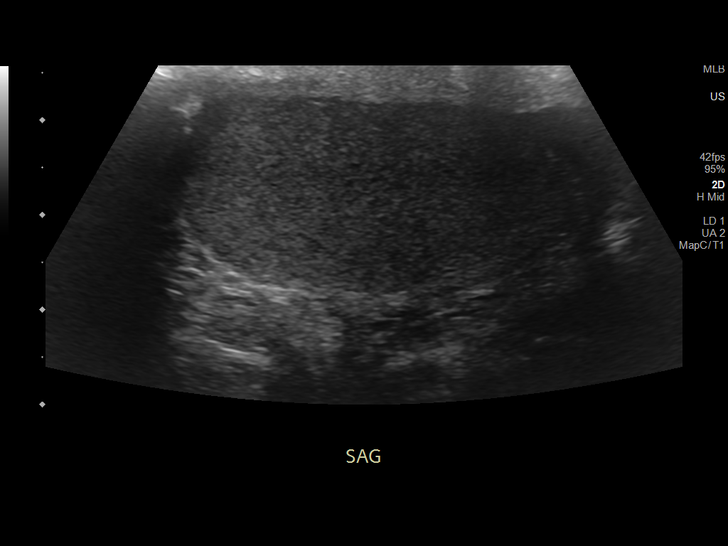
[im 35/52]
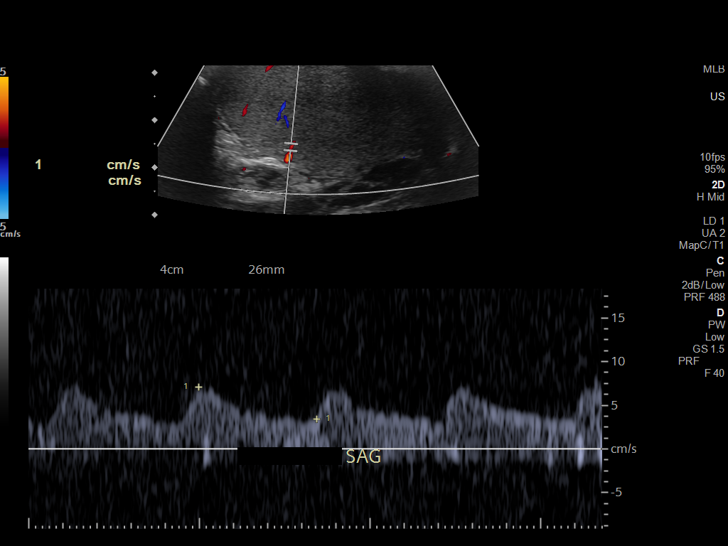
[im 39/52]
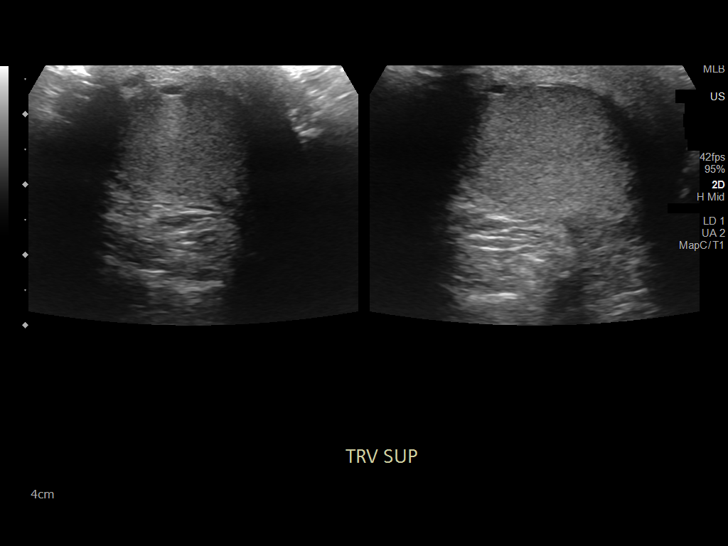
[im 43/52]
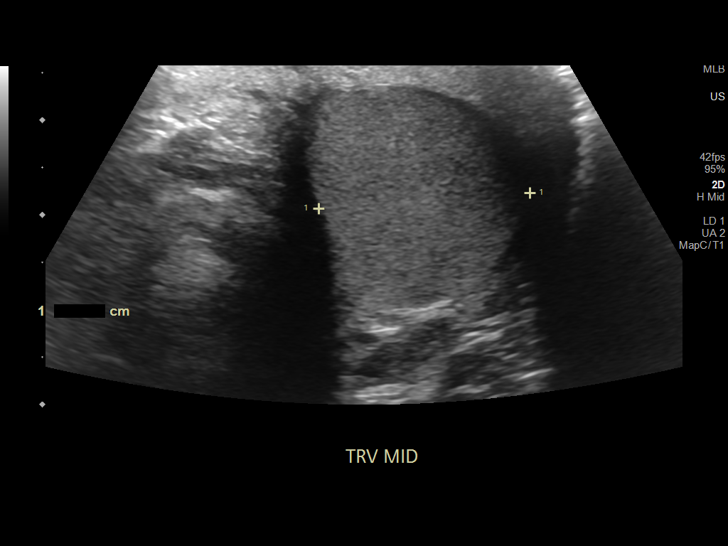
[im 47/52]
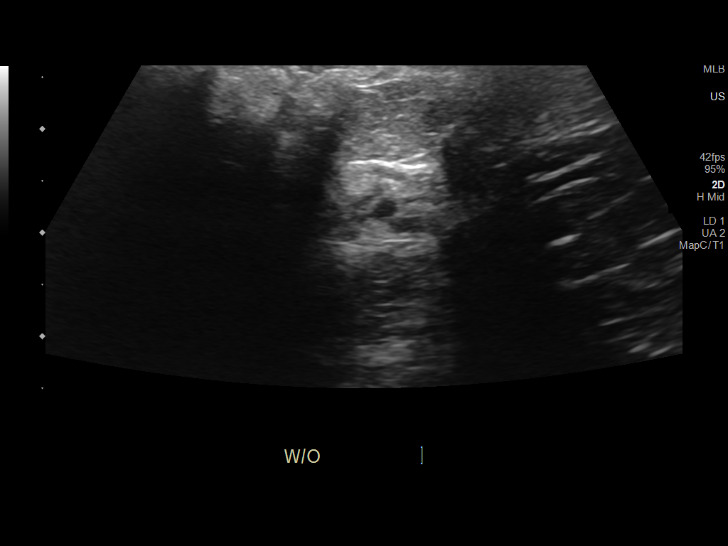
[im 52/52]
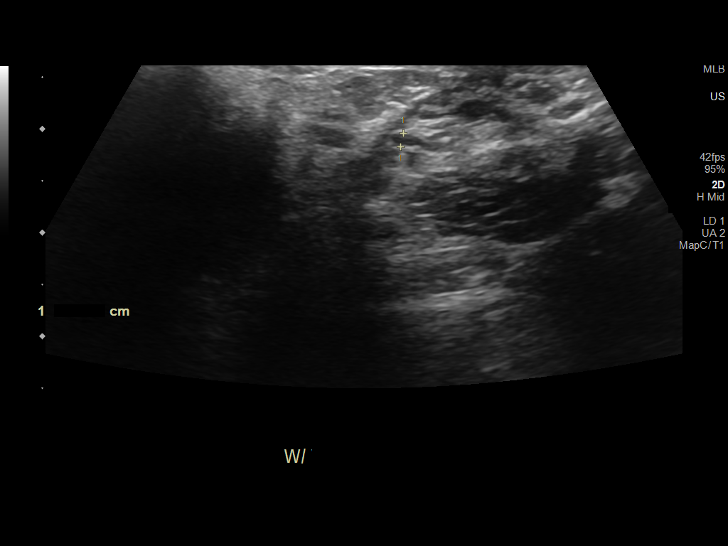

[13 of 25 positions shown; findings below may reference images not displayed]

FINDINGS: Right testicle

Measurements: 4.4 x 2.9 x 2.5 cm. No mass or microlithiasis
visualized.

Left testicle

Measurements: 4.5 x 2.3 x 2.2 cm. 3 mm cyst is noted. No mass or
microlithiasis visualized.

Right epididymis: Enlarged right epididymis is noted which is not
significantly changed compared to prior exam. It is slightly
hypervascular suggesting possible epididymitis.

Left epididymis:  Normal in size and appearance.

Hydrocele:  Small right hydrocele is noted.

Varicocele:  None visualized.

Pulsed Doppler interrogation of both testes demonstrates normal low
resistance arterial and venous waveforms bilaterally.
IMPRESSION: Enlarged right epididymis is noted which was enlarged on the prior
exam of 1998. It does appear to be mildly hypervascular on Doppler
and the possibility of epididymitis cannot be excluded. There is no
evidence of testicular mass or torsion.

## 2021-07-24 ENCOUNTER — Telehealth: Payer: Self-pay | Admitting: Internal Medicine

## 2021-07-24 ENCOUNTER — Other Ambulatory Visit: Payer: Self-pay

## 2021-07-24 ENCOUNTER — Ambulatory Visit: Payer: 59 | Admitting: Internal Medicine

## 2021-07-24 ENCOUNTER — Encounter: Payer: Self-pay | Admitting: Internal Medicine

## 2021-07-24 ENCOUNTER — Encounter (INDEPENDENT_AMBULATORY_CARE_PROVIDER_SITE_OTHER): Payer: Self-pay

## 2021-07-24 VITALS — BP 144/100 | HR 78 | Temp 98.5°F | Resp 16 | Ht 72.0 in | Wt 279.0 lb

## 2021-07-24 DIAGNOSIS — Z0001 Encounter for general adult medical examination with abnormal findings: Secondary | ICD-10-CM | POA: Diagnosis not present

## 2021-07-24 DIAGNOSIS — R0609 Other forms of dyspnea: Secondary | ICD-10-CM

## 2021-07-24 DIAGNOSIS — E559 Vitamin D deficiency, unspecified: Secondary | ICD-10-CM

## 2021-07-24 DIAGNOSIS — I10 Essential (primary) hypertension: Secondary | ICD-10-CM

## 2021-07-24 DIAGNOSIS — E785 Hyperlipidemia, unspecified: Secondary | ICD-10-CM | POA: Diagnosis not present

## 2021-07-24 DIAGNOSIS — Z1159 Encounter for screening for other viral diseases: Secondary | ICD-10-CM

## 2021-07-24 DIAGNOSIS — Z0000 Encounter for general adult medical examination without abnormal findings: Secondary | ICD-10-CM | POA: Insufficient documentation

## 2021-07-24 DIAGNOSIS — R0600 Dyspnea, unspecified: Secondary | ICD-10-CM

## 2021-07-24 DIAGNOSIS — R0683 Snoring: Secondary | ICD-10-CM | POA: Insufficient documentation

## 2021-07-24 DIAGNOSIS — Z23 Encounter for immunization: Secondary | ICD-10-CM

## 2021-07-24 LAB — CBC WITH DIFFERENTIAL/PLATELET
Basophils Absolute: 0 10*3/uL (ref 0.0–0.1)
Basophils Relative: 0.6 % (ref 0.0–3.0)
Eosinophils Absolute: 0.1 10*3/uL (ref 0.0–0.7)
Eosinophils Relative: 2.3 % (ref 0.0–5.0)
HCT: 44 % (ref 39.0–52.0)
Hemoglobin: 14.5 g/dL (ref 13.0–17.0)
Lymphocytes Relative: 48 % — ABNORMAL HIGH (ref 12.0–46.0)
Lymphs Abs: 1.7 10*3/uL (ref 0.7–4.0)
MCHC: 32.9 g/dL (ref 30.0–36.0)
MCV: 80 fl (ref 78.0–100.0)
Monocytes Absolute: 0.3 10*3/uL (ref 0.1–1.0)
Monocytes Relative: 7.6 % (ref 3.0–12.0)
Neutro Abs: 1.5 10*3/uL (ref 1.4–7.7)
Neutrophils Relative %: 41.5 % — ABNORMAL LOW (ref 43.0–77.0)
Platelets: 282 10*3/uL (ref 150.0–400.0)
RBC: 5.5 Mil/uL (ref 4.22–5.81)
RDW: 14.1 % (ref 11.5–15.5)
WBC: 3.6 10*3/uL — ABNORMAL LOW (ref 4.0–10.5)

## 2021-07-24 LAB — URINALYSIS, ROUTINE W REFLEX MICROSCOPIC
Bilirubin Urine: NEGATIVE
Hgb urine dipstick: NEGATIVE
Ketones, ur: NEGATIVE
Leukocytes,Ua: NEGATIVE
Nitrite: NEGATIVE
RBC / HPF: NONE SEEN (ref 0–?)
Specific Gravity, Urine: 1.025 (ref 1.000–1.030)
Total Protein, Urine: NEGATIVE
Urine Glucose: NEGATIVE
Urobilinogen, UA: 0.2 (ref 0.0–1.0)
WBC, UA: NONE SEEN (ref 0–?)
pH: 6 (ref 5.0–8.0)

## 2021-07-24 LAB — BASIC METABOLIC PANEL
BUN: 11 mg/dL (ref 6–23)
CO2: 30 mEq/L (ref 19–32)
Calcium: 9.8 mg/dL (ref 8.4–10.5)
Chloride: 103 mEq/L (ref 96–112)
Creatinine, Ser: 1.03 mg/dL (ref 0.40–1.50)
GFR: 97.66 mL/min (ref >60.00)
Glucose, Bld: 104 mg/dL — ABNORMAL HIGH (ref 70–99)
Potassium: 4.8 mEq/L (ref 3.5–5.1)
Sodium: 139 mEq/L (ref 135–145)

## 2021-07-24 LAB — LIPID PANEL
Cholesterol: 271 mg/dL — ABNORMAL HIGH (ref 0–200)
HDL: 37.9 mg/dL — ABNORMAL LOW (ref >39.00)
LDL Cholesterol: 201 mg/dL — ABNORMAL HIGH (ref 0–99)
NonHDL: 232.71
Total CHOL/HDL Ratio: 7
Triglycerides: 161 mg/dL — ABNORMAL HIGH (ref 0.0–149.0)
VLDL: 32.2 mg/dL (ref 0.0–40.0)

## 2021-07-24 LAB — HEPATIC FUNCTION PANEL
ALT: 43 U/L (ref 0–53)
AST: 22 U/L (ref 0–37)
Albumin: 4.7 g/dL (ref 3.5–5.2)
Alkaline Phosphatase: 54 U/L (ref 39–117)
Bilirubin, Direct: 0 mg/dL (ref 0.0–0.3)
Total Bilirubin: 0.4 mg/dL (ref 0.2–1.2)
Total Protein: 7.9 g/dL (ref 6.0–8.3)

## 2021-07-24 LAB — TSH: TSH: 1.52 u[IU]/mL (ref 0.35–5.50)

## 2021-07-24 LAB — VITAMIN D 25 HYDROXY (VIT D DEFICIENCY, FRACTURES): VITD: 20.82 ng/mL — ABNORMAL LOW (ref 30.00–100.00)

## 2021-07-24 MED ORDER — INDAPAMIDE 1.25 MG PO TABS: 1.2500 mg | ORAL_TABLET | Freq: Every day | ORAL | 0 refills | Status: DC

## 2021-07-24 MED ORDER — CHOLECALCIFEROL 125 MCG (5000 UT) PO CAPS: 5000.0000 [IU] | ORAL_CAPSULE | Freq: Every day | ORAL | 0 refills | Status: AC

## 2021-07-24 MED ORDER — ROSUVASTATIN CALCIUM 20 MG PO TABS: 20.0000 mg | ORAL_TABLET | Freq: Every day | ORAL | 1 refills | Status: AC

## 2021-07-24 NOTE — Telephone Encounter (Signed)
Patient thought he was supposed to be prescribed some medication for his hypertension.   Please clarify.

## 2021-07-24 NOTE — Patient Instructions (Signed)

## 2021-07-24 NOTE — Telephone Encounter (Signed)
Called pt, LVM.   

## 2021-07-24 NOTE — Telephone Encounter (Signed)
Please advise per Epic no meds were prescribed.

## 2021-07-24 NOTE — Progress Notes (Signed)
Subjective:  Patient ID: Nathan Arnold, male    DOB: 11-05-1991  Age: 30 y.o. MRN: [id number]  CC: Annual Exam and Hypertension  This visit occurred during the SARS-CoV-2 public health emergency.  Safety protocols were in place, including screening questions prior to the visit, additional usage of staff PPE, and extensive cleaning of exam room while observing appropriate contact time as indicated for disinfecting solutions.    HPI Cavan Bearden presents for a CPX and to establish.  His wife complains about his snoring and she witnesses apnea.  He complains of weight gain.  He recently saw his dentist and his blood pressure was 155/101 and 144/96.  He walks and climbs stairs.  He does not feel dyspnea on exertion he has climbed 3 flights of stairs.  He denies chest pain, diaphoresis, dizziness, lightheadedness, or edema.  Outpatient Medications Prior to Visit  Medication Sig Dispense Refill   ciprofloxacin (CIPRO) 500 MG tablet Take 1 tablet (500 mg total) by mouth 2 (two) times daily. 20 tablet 0   doxycycline (VIBRAMYCIN) 100 MG capsule Take 1 capsule (100 mg total) by mouth 2 (two) times daily. 20 capsule 0   HYDROcodone-acetaminophen (NORCO) 5-325 MG per tablet Take 1-2 tablets by mouth every 6 (six) hours as needed. 15 tablet 0   ibuprofen (ADVIL,MOTRIN) 200 MG tablet Take 200-800 mg by mouth every 6 (six) hours as needed for mild pain or moderate pain.     No facility-administered medications prior to visit.    ROS Review of Systems  Constitutional:  Positive for unexpected weight change (wt gain). Negative for diaphoresis and fatigue.  HENT: Negative.    Eyes:  Negative for visual disturbance.  Respiratory:  Positive for apnea and shortness of breath. Negative for cough and wheezing.   Cardiovascular:  Negative for chest pain, palpitations and leg swelling.  Gastrointestinal:  Negative for abdominal pain, constipation, diarrhea, nausea and vomiting.  Endocrine: Negative.    Genitourinary: Negative.  Negative for difficulty urinating, hematuria, testicular pain and urgency.  Musculoskeletal: Negative.  Negative for arthralgias and myalgias.  Skin: Negative.  Negative for color change and pallor.  Neurological: Negative.  Negative for dizziness, weakness, light-headedness and headaches.  Hematological:  Negative for adenopathy. Does not bruise/bleed easily.  Psychiatric/Behavioral: Negative.     Objective:  BP (!) 144/100 (BP Location: Right Arm, Patient Position: Sitting, Cuff Size: Large) Comment: BP (R) 144/102 (L) 146/96  Pulse 78   Temp 98.5 F (36.9 C) (Oral)   Resp 16   Ht 6' (1.829 m)   Wt 279 lb (126.6 kg)   SpO2 99%   BMI 37.84 kg/m   BP Readings from Last 3 Encounters:  07/24/21 (!) 144/100  04/04/20 (!) 121/94  08/10/14 118/78    Wt Readings from Last 3 Encounters:  07/24/21 279 lb (126.6 kg)  04/04/20 260 lb (117.9 kg)  08/10/14 245 lb (111.1 kg)    Physical Exam Vitals reviewed.  Constitutional:      Appearance: He is obese.  HENT:     Nose: Nose normal.     Mouth/Throat:     Mouth: Mucous membranes are moist.  Eyes:     Conjunctiva/sclera: Conjunctivae normal.  Cardiovascular:     Rate and Rhythm: Normal rate and regular rhythm.     Heart sounds: No murmur heard.    Comments: EKG- NSR with short PR TWI in I No LVH or Q waves Pulmonary:     Effort: Pulmonary effort is normal.  Breath sounds: No stridor. No wheezing, rhonchi or rales.  Abdominal:     General: Abdomen is protuberant. Bowel sounds are normal. There is no distension.     Palpations: Abdomen is soft. There is no hepatomegaly, splenomegaly or mass.     Tenderness: There is no abdominal tenderness.     Hernia: No hernia is present.  Musculoskeletal:        General: Normal range of motion.     Cervical back: Neck supple.     Right lower leg: No edema.     Left lower leg: No edema.  Lymphadenopathy:     Cervical: No cervical adenopathy.  Skin:     General: Skin is warm and dry.     Coloration: Skin is not pale.     Findings: No rash.  Neurological:     General: No focal deficit present.     Mental Status: He is alert. Mental status is at baseline.  Psychiatric:        Mood and Affect: Mood normal.        Behavior: Behavior normal.    Lab Results  Component Value Date   WBC 3.6 (L) 07/24/2021   HGB 14.5 07/24/2021   HCT 44.0 07/24/2021   PLT 282.0 07/24/2021   GLUCOSE 104 (H) 07/24/2021   CHOL 271 (H) 07/24/2021   TRIG 161.0 (H) 07/24/2021   HDL 37.90 (L) 07/24/2021   LDLCALC 201 (H) 07/24/2021   ALT 43 07/24/2021   AST 22 07/24/2021   NA 139 07/24/2021   K 4.8 07/24/2021   CL 103 07/24/2021   CREATININE 1.03 07/24/2021   BUN 11 07/24/2021   CO2 30 07/24/2021   TSH 1.52 07/24/2021    US SCROTUM W/DOPPLER  Result Date: 04/04/2020 CLINICAL DATA:  Acute right testicular pain. EXAM: SCROTAL ULTRASOUND DOPPLER ULTRASOUND OF THE TESTICLES TECHNIQUE: Complete ultrasound examination of the testicles, epididymis, and other scrotal structures was performed. Color and spectral Doppler ultrasound were also utilized to evaluate blood flow to the testicles. COMPARISON:  August 10, 2014. FINDINGS: Right testicle Measurements: 4.4 x 2.9 x 2.5 cm. No mass or microlithiasis visualized. Left testicle Measurements: 4.5 x 2.3 x 2.2 cm. 3 mm cyst is noted. No mass or microlithiasis visualized. Right epididymis: Enlarged right epididymis is noted which is not significantly changed compared to prior exam. It is slightly hypervascular suggesting possible epididymitis. Left epididymis:  Normal in size and appearance. Hydrocele:  Small right hydrocele is noted. Varicocele:  None visualized. Pulsed Doppler interrogation of both testes demonstrates normal low resistance arterial and venous waveforms bilaterally. IMPRESSION: Enlarged right epididymis is noted which was enlarged on the prior exam of 2015. It does appear to be mildly hypervascular on  Doppler and the possibility of epididymitis cannot be excluded. There is no evidence of testicular mass or torsion. Electronically Signed   By: Lupita Raider M.D.   On: 04/04/2020 15:18    Assessment & Plan:   Jasan was seen today for annual exam and hypertension.  Diagnoses and all orders for this visit:  Encounter for general adult medical examination with abnormal findings- Exam completed, labs reviewed, vaccines reviewed and updated, no cancer screenings indicated, patient education was given. -     Lipid panel; Future -     Hepatitis C antibody; Future -     HIV Antibody (routine testing w rflx); Future -     HIV Antibody (routine testing w rflx) -     Hepatitis C antibody -  Lipid panel  Primary hypertension- His EKG is reassuring.  I will check labs to screen for secondary causes and endorgan damage.  I recommended that he treat this with indapamide. -     CBC with Differential/Platelet; Future -     Basic metabolic panel; Future -     TSH; Future -     Urinalysis, Routine w reflex microscopic; Future -     Hepatic function panel; Future -     VITAMIN D 25 Hydroxy (Vit-D Deficiency, Fractures); Future -     Aldosterone + renin activity w/ ratio; Future -     EKG 12-Lead -     Aldosterone + renin activity w/ ratio -     VITAMIN D 25 Hydroxy (Vit-D Deficiency, Fractures) -     Hepatic function panel -     Urinalysis, Routine w reflex microscopic -     TSH -     Basic metabolic panel -     CBC with Differential/Platelet -     indapamide (LOZOL) 1.25 MG tablet; Take 1 tablet (1.25 mg total) by mouth daily.  Loud snoring -     TSH; Future -     Ambulatory referral to Sleep Studies -     TSH  Need for hepatitis C screening test -     Hepatitis C antibody; Future -     Hepatitis C antibody  Need for Tdap vaccination -     Tdap vaccine greater than or equal to 7yo IM  Vitamin D deficiency disease -     Cholecalciferol 125 MCG (5000 UT) capsule; Take 1 capsule  (5,000 Units total) by mouth daily.  Hyperlipidemia with target LDL less than 160 -     rosuvastatin (CRESTOR) 20 MG tablet; Take 1 tablet (20 mg total) by mouth daily.  DOE (dyspnea on exertion)- Will screen for atherosclerosis with a CT calcium score. -     CT CARDIAC SCORING (SELF PAY ONLY); Future  I have discontinued Hiren Gura's ibuprofen, ciprofloxacin, HYDROcodone-acetaminophen, and doxycycline. I am also having him start on Cholecalciferol, indapamide, and rosuvastatin.  Meds ordered this encounter  Medications   Cholecalciferol 125 MCG (5000 UT) capsule    Sig: Take 1 capsule (5,000 Units total) by mouth daily.    Dispense:  90 capsule    Refill:  0   indapamide (LOZOL) 1.25 MG tablet    Sig: Take 1 tablet (1.25 mg total) by mouth daily.    Dispense:  90 tablet    Refill:  0   rosuvastatin (CRESTOR) 20 MG tablet    Sig: Take 1 tablet (20 mg total) by mouth daily.    Dispense:  90 tablet    Refill:  1      Follow-up: Return in about 3 months (around 10/24/2021).  Sanda Linger, MD

## 2021-07-27 ENCOUNTER — Encounter: Payer: Self-pay | Admitting: Internal Medicine

## 2021-07-31 ENCOUNTER — Encounter: Payer: Self-pay | Admitting: Internal Medicine

## 2021-07-31 LAB — ALDOSTERONE + RENIN ACTIVITY W/ RATIO
ALDO / PRA Ratio: 5 Ratio (ref 0.9–28.9)
Aldosterone: 6 ng/dL
Renin Activity: 1.2 ng/mL/h (ref 0.25–5.82)

## 2021-07-31 LAB — HIV ANTIBODY (ROUTINE TESTING W REFLEX): HIV 1&2 Ab, 4th Generation: NONREACTIVE

## 2021-07-31 LAB — HEPATITIS C ANTIBODY
Hepatitis C Ab: NONREACTIVE
SIGNAL TO CUT-OFF: 0.03 (ref <1.00)

## 2021-08-08 ENCOUNTER — Encounter: Payer: Self-pay | Admitting: Internal Medicine

## 2021-11-22 ENCOUNTER — Other Ambulatory Visit: Payer: Self-pay | Admitting: Internal Medicine

## 2021-11-22 DIAGNOSIS — I10 Essential (primary) hypertension: Secondary | ICD-10-CM

## 2022-08-08 ENCOUNTER — Encounter: Payer: 59 | Admitting: Internal Medicine

## 2022-12-26 ENCOUNTER — Telehealth: Payer: Self-pay | Admitting: Internal Medicine

## 2022-12-26 NOTE — Telephone Encounter (Signed)
Yes, this is okay with me 

## 2022-12-26 NOTE — Telephone Encounter (Signed)
Pt is wanting to transfer his care from Dr. Ronnald Ramp to Dr. Ethelene Hal. He feels Dr. Ethelene Hal has more availability for appointments. He scheduled his TOC via mychart. Just let me know and I'll get back in touch with him. [phone]

## 2023-01-04 ENCOUNTER — Telehealth: Payer: Self-pay | Admitting: Family Medicine

## 2023-01-04 ENCOUNTER — Ambulatory Visit: Payer: 59 | Admitting: Family Medicine

## 2023-01-04 ENCOUNTER — Encounter: Payer: Self-pay | Admitting: Family Medicine

## 2023-01-04 VITALS — BP 132/82 | HR 75 | Temp 98.3°F | Ht 73.75 in | Wt 289.2 lb

## 2023-01-04 DIAGNOSIS — Z0000 Encounter for general adult medical examination without abnormal findings: Secondary | ICD-10-CM

## 2023-01-04 DIAGNOSIS — R0683 Snoring: Secondary | ICD-10-CM | POA: Diagnosis not present

## 2023-01-04 DIAGNOSIS — E559 Vitamin D deficiency, unspecified: Secondary | ICD-10-CM | POA: Diagnosis not present

## 2023-01-04 NOTE — Telephone Encounter (Signed)
Pt needs documents stating that he had a cpe done on 1.19.24 . Pt stated it a form need to filled out for him . Pt stated he is going to try to download form on mychart but if that doe's not work pt will drop form off monday

## 2023-01-04 NOTE — Progress Notes (Signed)
Established Patient Office Visit   Subjective:  Patient ID: Nathan Arnold, male    DOB: 1991-04-29  Age: 32 y.o. MRN: [id number]  Chief Complaint  Patient presents with   Establish Care    Non fasting. Concerned about b/p     HPI Encounter Diagnoses  Name Primary?   Healthcare maintenance Yes   Snores    Vitamin D deficiency disease    For establishment of care and a physical exam.  History of hypertension in the past.  History of elevated LDL cholesterol.  He is currently not being treated for these issues.  Blood pressure has been elevated just before his marriage a year ago.  He was also undergoing extensive dental work around that time and had eye been stressed.  He denies headaches blurred vision or shortness of breath.  He is married and lives with his wife.  He goes for regular dental care.  He is not currently exercising regularly.  He does snore.  Headache episodes have been witnessed.   Review of Systems  Constitutional: Negative.   HENT: Negative.    Eyes:  Negative for blurred vision, discharge and redness.  Respiratory: Negative.  Negative for shortness of breath.   Cardiovascular: Negative.   Gastrointestinal:  Negative for abdominal pain.  Genitourinary: Negative.   Musculoskeletal: Negative.  Negative for myalgias.  Skin:  Negative for rash.  Neurological:  Negative for tingling, loss of consciousness, weakness and headaches.  Endo/Heme/Allergies:  Negative for polydipsia.     Current Outpatient Medications:    Cholecalciferol 125 MCG (5000 UT) capsule, Take 1 capsule (5,000 Units total) by mouth daily., Disp: 90 capsule, Rfl: 0   indapamide (LOZOL) 1.25 MG tablet, TAKE 1 TABLET(1.25 MG) BY MOUTH DAILY (Patient not taking: Reported on 01/04/2023), Disp: 90 tablet, Rfl: 0   rosuvastatin (CRESTOR) 20 MG tablet, Take 1 tablet (20 mg total) by mouth daily. (Patient not taking: Reported on 01/04/2023), Disp: 90 tablet, Rfl: 1   Objective:     BP 132/82 (BP  Location: Right Arm, Patient Position: Sitting, Cuff Size: Large)   Pulse 75   Temp 98.3 F (36.8 C) (Temporal)   Ht 6' 1.75" (1.873 m)   Wt 289 lb 3.2 oz (131.2 kg)   SpO2 98%   BMI 37.38 kg/m    Physical Exam Constitutional:      General: He is not in acute distress.    Appearance: Normal appearance. He is obese. He is not ill-appearing, toxic-appearing or diaphoretic.  HENT:     Head: Normocephalic and atraumatic.     Right Ear: External ear normal.     Left Ear: External ear normal.     Mouth/Throat:     Mouth: Mucous membranes are moist.     Pharynx: Oropharynx is clear. No oropharyngeal exudate or posterior oropharyngeal erythema.   Eyes:     General: No scleral icterus.       Right eye: No discharge.        Left eye: No discharge.     Extraocular Movements: Extraocular movements intact.     Conjunctiva/sclera: Conjunctivae normal.     Pupils: Pupils are equal, round, and reactive to light.  Cardiovascular:     Rate and Rhythm: Normal rate and regular rhythm.  Pulmonary:     Effort: Pulmonary effort is normal. No respiratory distress.     Breath sounds: Normal breath sounds.  Abdominal:     General: Bowel sounds are normal.     Tenderness: There  is no abdominal tenderness. There is no guarding.     Hernia: There is no hernia in the left inguinal area or right inguinal area.  Genitourinary:    Penis: Circumcised. No hypospadias, erythema, tenderness, discharge, swelling or lesions.      Testes:        Right: Mass, tenderness or swelling not present. Right testis is descended.        Left: Mass, tenderness or swelling not present. Left testis is descended.     Epididymis:     Right: Not inflamed or enlarged.     Left: Not inflamed or enlarged.  Musculoskeletal:     Cervical back: No rigidity or tenderness.  Lymphadenopathy:     Lower Body: No right inguinal adenopathy. No left inguinal adenopathy.  Skin:    General: Skin is warm and dry.  Neurological:      Mental Status: He is alert and oriented to person, place, and time.  Psychiatric:        Mood and Affect: Mood normal.        Behavior: Behavior normal.      No results found for any visits on 01/04/23.    The ASCVD Risk score (Arnett DK, et al., 2019) failed to calculate for the following reasons:   The 2019 ASCVD risk score is only valid for ages 40 to 93    Assessment & Plan:   Healthcare maintenance -     CBC; Future -     Comprehensive metabolic panel; Future -     Lipid panel; Future -     Urinalysis, Routine w reflex microscopic; Future  Snores -     Ambulatory referral to Pulmonology  Vitamin D deficiency disease -     VITAMIN D 25 Hydroxy (Vit-D Deficiency, Fractures); Future    Return in about 6 months (around 07/05/2023), or Return fasting for blood work.  Check and record blood pressures periodically..  Information was given on health maintenance and disease prevention.  He will try to start exercising for 30 minutes 5 days weekly.  Walking his dog would be great.  Will work on weight loss.  Information was given about preventing hypertension.  Agrees to go for sleep study.   Libby Maw, MD

## 2023-01-07 NOTE — Telephone Encounter (Signed)
PAPERWORK/FORMS received  Dropped off by: pt's wife, Nathan Arnold, Call back #: [phone] Individual made aware of 3-5 business day turn around (YES/NO): yes GREEN charge sheet completed and patient made aware of possible charge (YES/NO): yes Placed in provider folder at front desk. ~~~ route to CMA/provider Team  CLINICAL USE BELOW THIS LINE (use X to signify action taken)  ___ Form received and placed in providers office for signature. ___ Form completed and faxed to LOA Dept.  ___ Form completed & LVM to notify patient ready for pick up.  ___ Charge sheet and copy of form in front office folder for office supervisor.

## 2023-01-10 NOTE — Telephone Encounter (Signed)
__X_ Form received and placed in providers office for signature. ___ Form completed and faxed to LOA Dept.  _X__ Form completed & LVM to notify patient ready for pick up.  _X_ Charge sheet and copy of form in front office folder for office supervisor.

## 2023-02-12 ENCOUNTER — Encounter: Payer: 59 | Admitting: Internal Medicine

## 2023-07-05 ENCOUNTER — Ambulatory Visit: Payer: 59 | Admitting: Family Medicine

## 2023-07-05 ENCOUNTER — Telehealth: Payer: Self-pay | Admitting: Family Medicine

## 2023-07-05 NOTE — Telephone Encounter (Signed)
7.19.24 no show /no letter sent due to power outage

## 2023-07-10 NOTE — Telephone Encounter (Signed)
2nd no show w/in division in 12 months, final warning letter sent via mail and [address]
